# Patient Record
Sex: Male | Born: 1985 | Race: Black or African American | Hispanic: No | Marital: Single | Smoking: Current every day smoker
Health system: Southern US, Community
[De-identification: ages and names within clinical notes are randomized; demographics above are authoritative.]

---

## 2020-02-11 ENCOUNTER — Other Ambulatory Visit: Payer: Self-pay

## 2020-02-11 ENCOUNTER — Emergency Department
Admission: EM | Admit: 2020-02-11 | Discharge: 2020-02-11 | Disposition: A | Discharge status: Home / Self Care | Payer: Self-pay | Attending: Emergency Medicine | Admitting: Emergency Medicine

## 2020-02-11 ENCOUNTER — Emergency Department: Payer: Self-pay

## 2020-02-11 DIAGNOSIS — F458 Other somatoform disorders: Secondary | ICD-10-CM | POA: Insufficient documentation

## 2020-02-11 DIAGNOSIS — R0989 Other specified symptoms and signs involving the circulatory and respiratory systems: Secondary | ICD-10-CM

## 2020-02-11 DIAGNOSIS — F17210 Nicotine dependence, cigarettes, uncomplicated: Secondary | ICD-10-CM | POA: Insufficient documentation

## 2020-02-11 LAB — COMPREHENSIVE METABOLIC PANEL
ALT: 17 U/L (ref 0–44)
AST: 26 U/L (ref 15–41)
Albumin: 4.5 g/dL (ref 3.5–5.0)
Alkaline Phosphatase: 54 U/L (ref 38–126)
Anion gap: 9 (ref 5–15)
BUN: 10 mg/dL (ref 6–20)
CO2: 24 mmol/L (ref 22–32)
Calcium: 9.1 mg/dL (ref 8.9–10.3)
Chloride: 105 mmol/L (ref 98–111)
Creatinine, Ser: 0.96 mg/dL (ref 0.61–1.24)
GFR, Estimated: >60 mL/min (ref >60)
Glucose, Bld: 106 mg/dL — ABNORMAL HIGH (ref 70–99)
Potassium: 3.9 mmol/L (ref 3.5–5.1)
Sodium: 138 mmol/L (ref 135–145)
Total Bilirubin: 0.6 mg/dL (ref 0.3–1.2)
Total Protein: 7.4 g/dL (ref 6.5–8.1)

## 2020-02-11 LAB — CBC WITH DIFFERENTIAL/PLATELET
Abs Immature Granulocytes: 0.02 10*3/uL (ref 0.00–0.07)
Basophils Absolute: 0.1 10*3/uL (ref 0.0–0.1)
Basophils Relative: 1 %
Eosinophils Absolute: 0.1 10*3/uL (ref 0.0–0.5)
Eosinophils Relative: 1 %
HCT: 43.2 % (ref 39.0–52.0)
Hemoglobin: 13.9 g/dL (ref 13.0–17.0)
Immature Granulocytes: 0 %
Lymphocytes Relative: 40 %
Lymphs Abs: 2.6 10*3/uL (ref 0.7–4.0)
MCH: 29.4 pg (ref 26.0–34.0)
MCHC: 32.2 g/dL (ref 30.0–36.0)
MCV: 91.3 fL (ref 80.0–100.0)
Monocytes Absolute: 0.7 10*3/uL (ref 0.1–1.0)
Monocytes Relative: 11 %
Neutro Abs: 3 10*3/uL (ref 1.7–7.7)
Neutrophils Relative %: 47 %
Platelets: 297 10*3/uL (ref 150–400)
RBC: 4.73 MIL/uL (ref 4.22–5.81)
RDW: 12.3 % (ref 11.5–15.5)
WBC: 6.4 10*3/uL (ref 4.0–10.5)
nRBC: 0 % (ref 0.0–0.2)

## 2020-02-11 LAB — TSH: TSH: 0.632 u[IU]/mL (ref 0.350–4.500)

## 2020-02-11 IMAGING — CT CT NECK W/ CM
4 series · 16 of 33 positions shown, 19 images · IV contrast (omnipaque)
Comparison: None.

CLINICAL DATA: Cranial neuropathy

EXAM:
CT NECK WITH CONTRAST
TECHNIQUE: Multidetector CT imaging of the neck was performed using the
standard protocol following the bolus administration of intravenous
contrast.
CONTRAST:  75mL OMNIPAQUE IOHEXOL 300 MG/ML  SOLN

[Series 2: axial neck · axial · 0.52mm/px · z∈[-259,-107]mm · 5 of 116 slices shown, 7 images]
[im 20/116  soft-tissue]
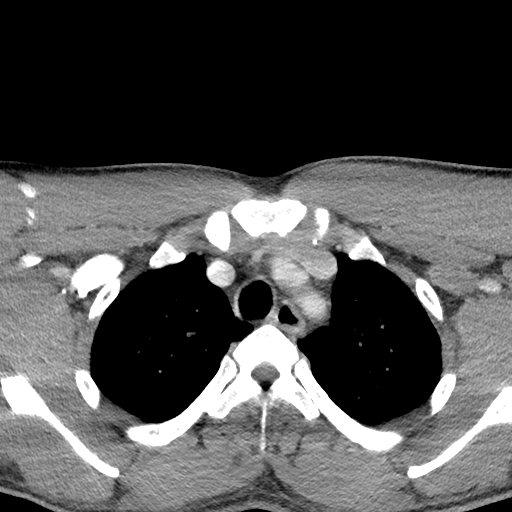
[im 20/116  bone]
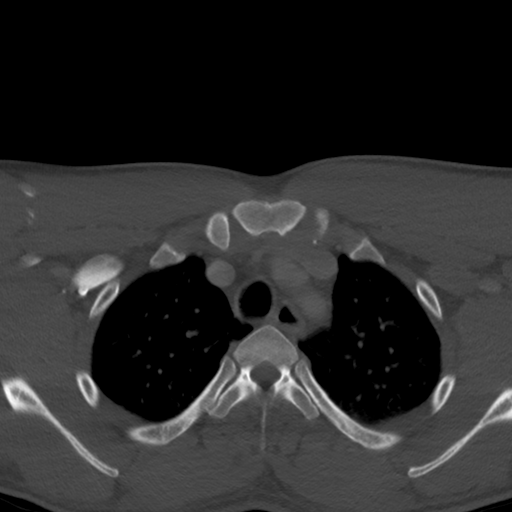
[im 39/116  bone]
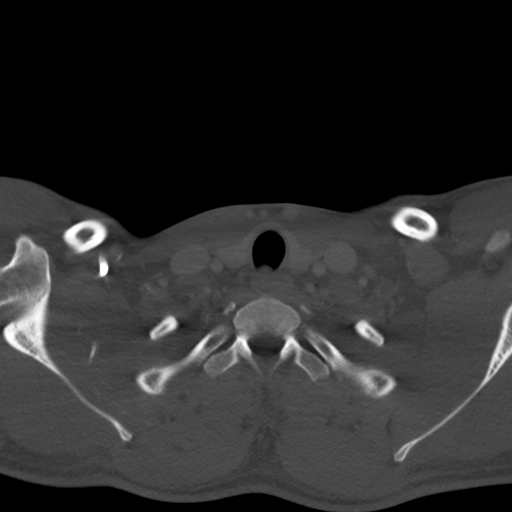
[im 58/116  bone]
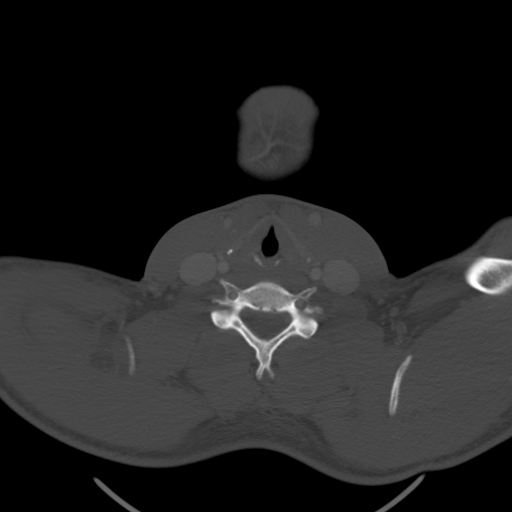
[im 77/116  bone]
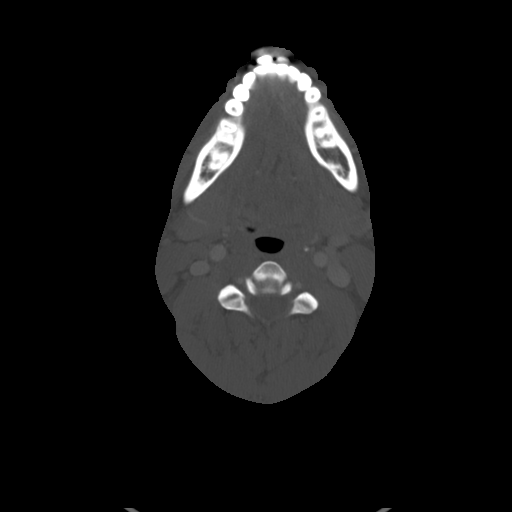
[im 96/116  soft-tissue]
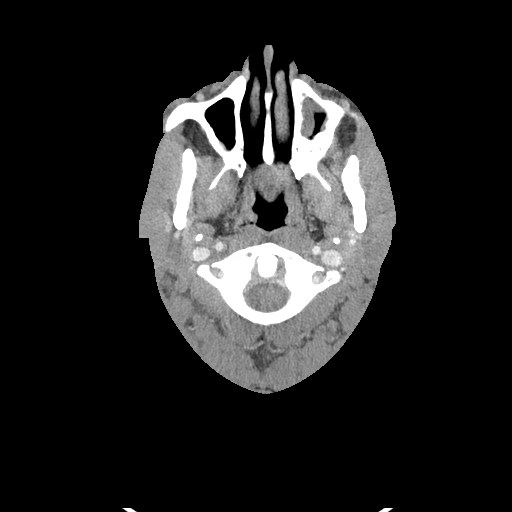
[im 96/116  bone]
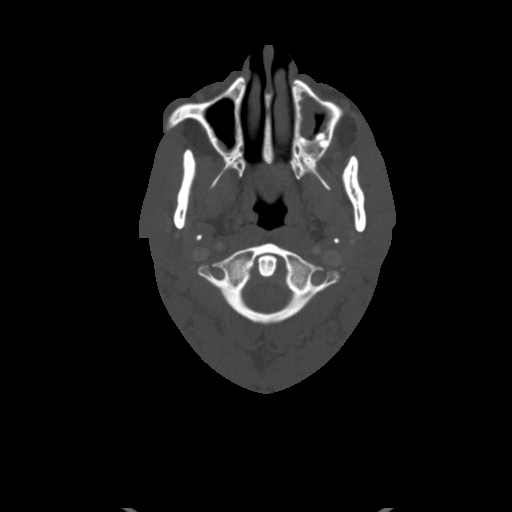

[Series 5: sag neck · sagittal · 0.50mm/px · 5 of 114 slices shown, 6 images]
[im 38/114  bone]
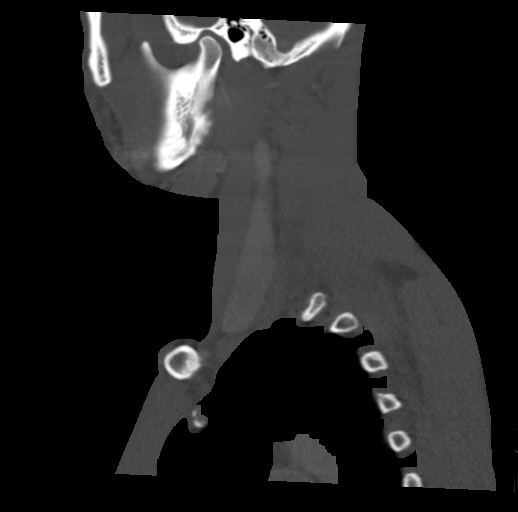
[im 48/114  bone]
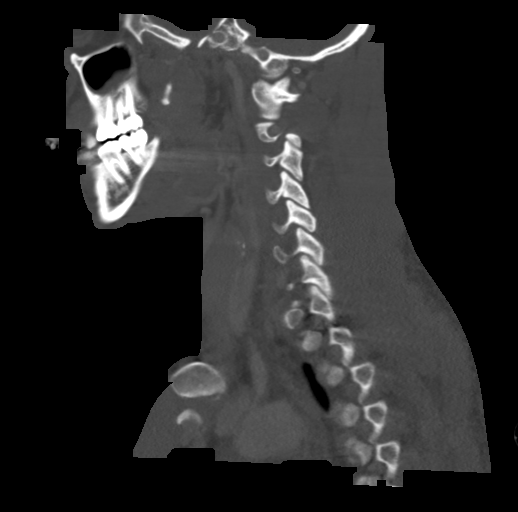
[im 57/114  soft-tissue]
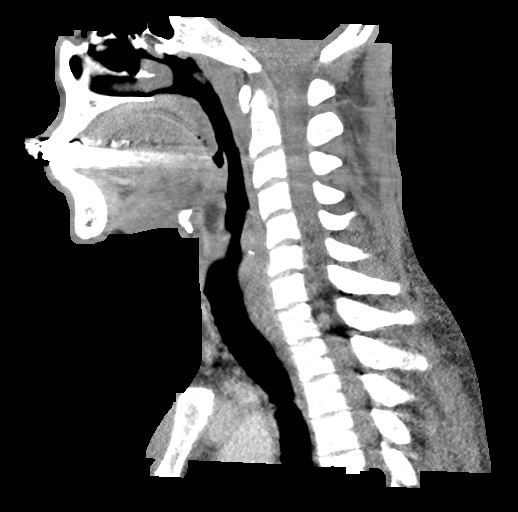
[im 57/114  bone]
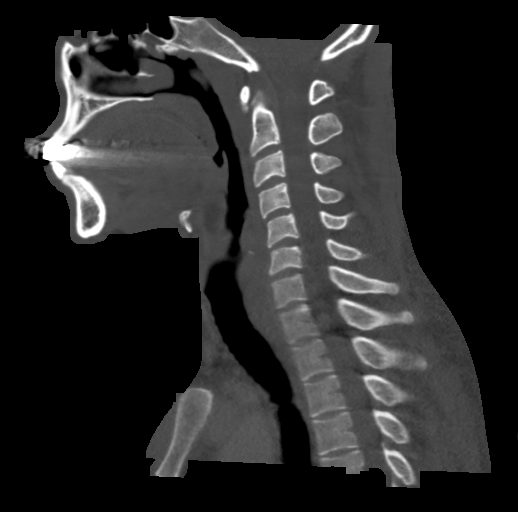
[im 66/114  bone]
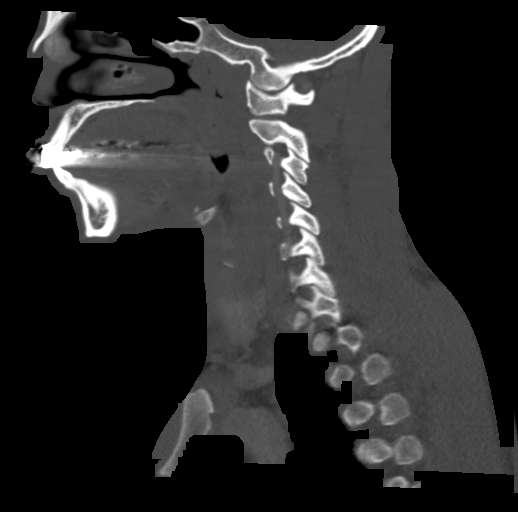
[im 76/114  bone]
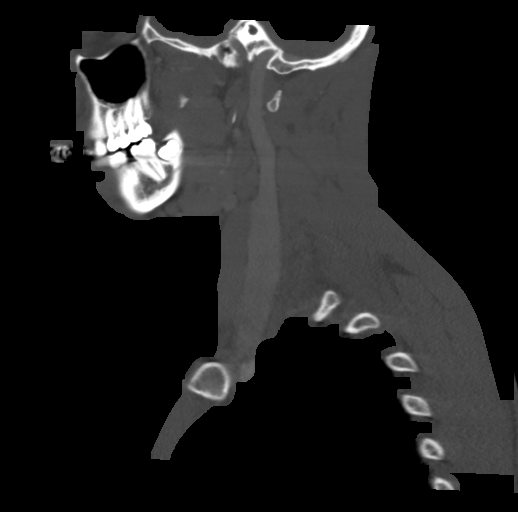

[Series 6: cor neck · coronal · 0.45mm/px · 3 of 131 slices shown]
[im 27/131  bone]
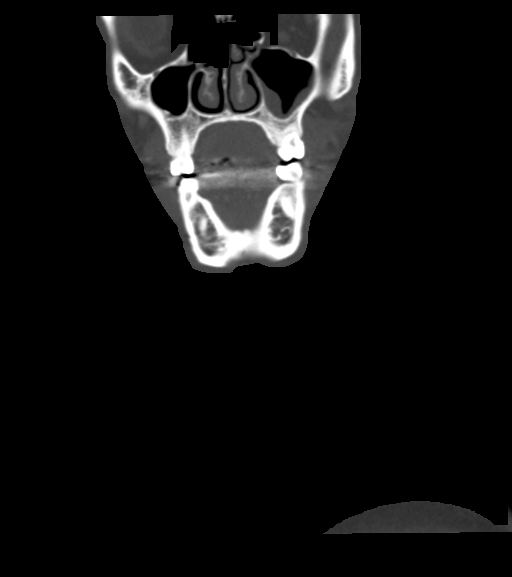
[im 53/131  bone]
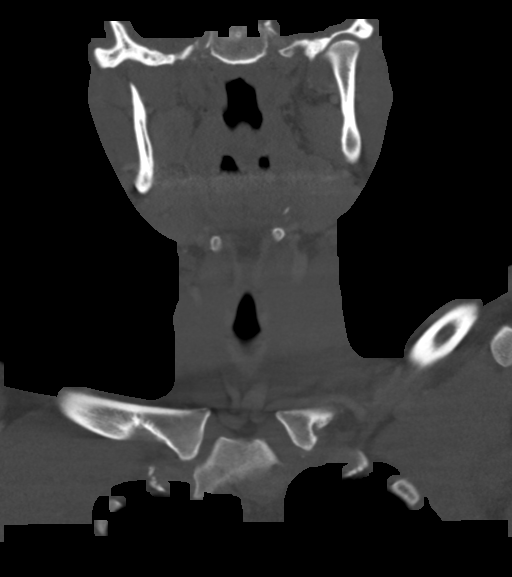
[im 79/131  bone]
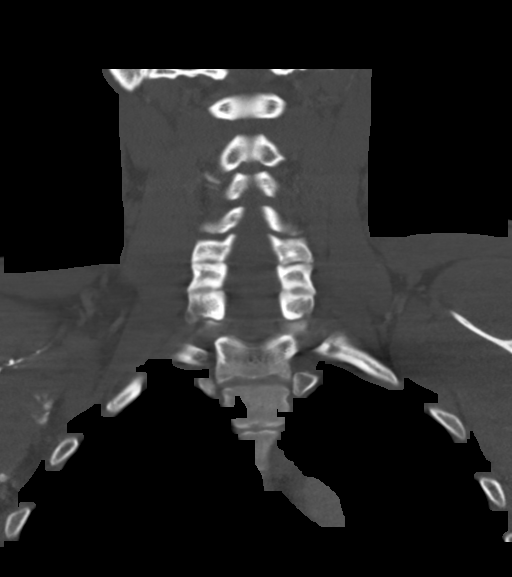

[Series 7: orthogonal ax · axial · 0.45mm/px · z∈[-269,-187]mm · 3 of 123 slices shown]
[im 21/123  bone]
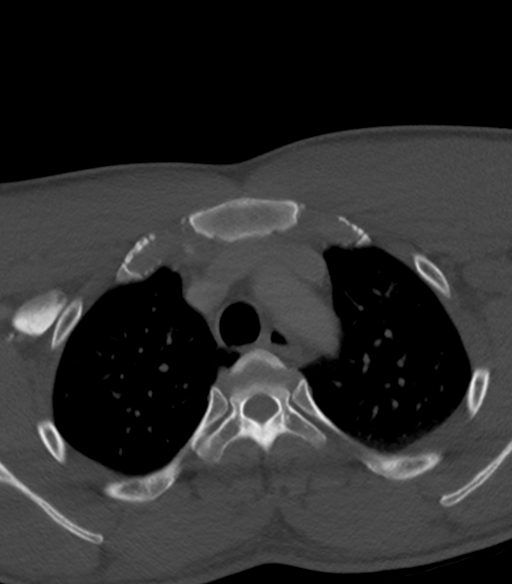
[im 41/123  bone]
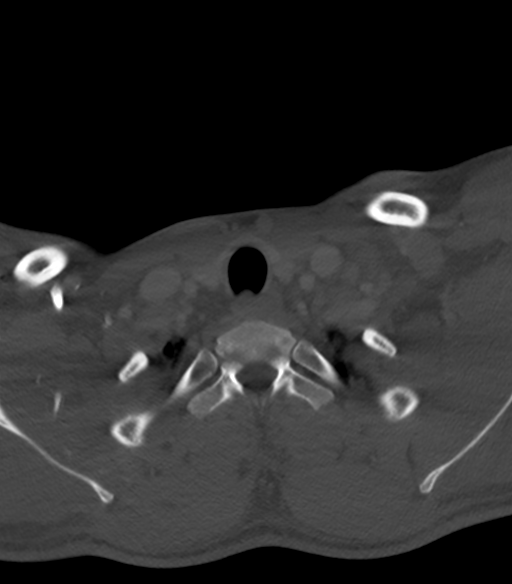
[im 62/123  bone]
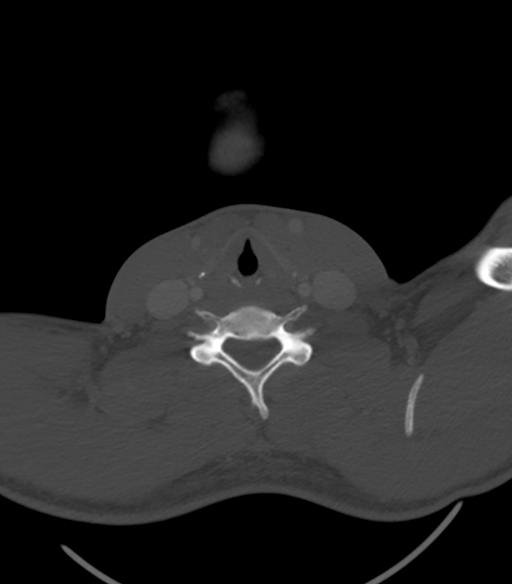

[16 of 33 positions shown; findings below may reference images not displayed]

FINDINGS: Pharynx and larynx: Normal. No mass or swelling. No evidence of
vocal cord paralysis.

Salivary glands: No inflammation, mass, or stone.

Thyroid: Normal.  No focal lesion.

Lymph nodes: None enlarged or abnormal density.

Vascular: Normal intravascular enhancement.

Limited intracranial: Grossly unremarkable.

Visualized orbits: Normal orbits.

Mastoids and visualized paranasal sinuses: Mild bilateral maxillary
sinus mucosal thickening. Clear mastoid air cells.

Skeleton: No acute or suspicious osseous abnormalities.

Upper chest: Dependent atelectasis.

Other: None.
IMPRESSION: No acute finding within the neck.

Normal thyroid gland.  No focal thyroid lesions.

## 2020-02-11 MED ORDER — IOHEXOL 300 MG/ML  SOLN
75.0000 mL | Freq: Once | INTRAMUSCULAR | Status: AC | PRN
  Administered 2020-02-11: 75 mL via INTRAVENOUS

## 2020-02-11 MED ORDER — OMEPRAZOLE 20 MG PO CPDR: 20.0000 mg | DELAYED_RELEASE_CAPSULE | Freq: Every day | ORAL | 1 refills | Status: AC

## 2020-02-11 NOTE — ED Provider Notes (Signed)
[facility] Emergency Department Provider Note   ____________________________________________   First MD Initiated Contact with Patient 02/11/20 0800     (approximate)  I have reviewed the triage vital signs and the nursing notes.   HISTORY  Chief Complaint throat pain    HPI Randall Vang is a 34 y.o. male with no significant past [place] who presents to the ED complaining of throat pain.  Patient reports that for the past couple of weeks he has felt like there is something in the back of his throat.  He denies any difficulty swallowing, has been tolerating both liquids and solids without difficulty.  He also has not had any pain but just has a constant feeling of something in the back of his throat.  He cannot recall any situation where something has gotten stuck and he denies any similar issues prior to a couple weeks ago.  He has not had any fevers, cough, chest pain, or shortness of breath.  He was seen by his PCP a few days ago, subsequently advised to be seen in the ED if his symptoms continue.        History reviewed. No pertinent past medical history.  There are no problems to display for this patient.   History reviewed. No pertinent surgical history.  Prior to Admission medications   Medication Sig Start Date End Date Taking? Authorizing Provider  omeprazole (PRILOSEC) 20 MG capsule Take 1 capsule (20 mg total) by mouth daily. 02/11/20 04/11/20  Chesley Noon, MD    Allergies Patient has no allergy information on record.  No family history on file.  Social History Social History   Tobacco Use  . Smoking status: Current Every Day Smoker    Types: Cigarettes  Substance Use Topics  . Alcohol use: Yes  . Drug use: Yes    Types: Marijuana    Review of Systems  Constitutional: No fever/chills Eyes: No visual changes. ENT: No sore throat.  Positive for globus sensation. Cardiovascular: Denies chest pain. Respiratory: Denies  shortness of breath. Gastrointestinal: No abdominal pain.  No nausea, no vomiting.  No diarrhea.  No constipation. Genitourinary: Negative for dysuria. Musculoskeletal: Negative for back pain. Skin: Negative for rash. Neurological: Negative for headaches, focal weakness or numbness.  ____________________________________________   PHYSICAL EXAM:  VITAL SIGNS: ED Triage Vitals  Enc Vitals Group     BP 02/11/20 0737 (!) 137/110     Pulse Rate 02/11/20 0737 75     Resp 02/11/20 0737 18     Temp 02/11/20 0737 98.8 F (37.1 C)     Temp Source 02/11/20 0737 Oral     SpO2 02/11/20 0737 99 %     Weight 02/11/20 0741 164 lb (74.4 kg)     Height 02/11/20 0741 5\' 9"  (1.753 m)     Head Circumference --      Peak Flow --      Pain Score 02/11/20 0741 0     Pain Loc --      Pain Edu? --      Excl. in GC? --     Constitutional: Alert and oriented. Eyes: Conjunctivae are normal. Head: Atraumatic. Nose: No congestion/rhinnorhea. Mouth/Throat: Mucous membranes are moist.  Oropharynx clear with no erythema or edema. Neck: Normal ROM, no anterior neck swelling or tenderness. Cardiovascular: Normal rate, regular rhythm. Grossly normal heart sounds. Respiratory: Normal respiratory effort.  No retractions. Lungs CTAB. Gastrointestinal: Soft and nontender. No distention. Genitourinary: deferred Musculoskeletal: No lower extremity  tenderness nor edema. Neurologic:  Normal speech and language. No gross focal neurologic deficits are appreciated. Skin:  Skin is warm, dry and intact. No rash noted. Psychiatric: Mood and affect are normal. Speech and behavior are normal.  ____________________________________________   LABS (all labs ordered are listed, but only abnormal results are displayed)  Labs Reviewed  COMPREHENSIVE METABOLIC PANEL - Abnormal; Notable for the following components:      Result Value   Glucose, Bld 106 (*)    All other components within normal limits  TSH  CBC WITH  DIFFERENTIAL/PLATELET    PROCEDURES  Procedure(s) performed (including Critical Care):  Procedures   ____________________________________________   INITIAL IMPRESSION / ASSESSMENT AND PLAN / ED COURSE       34 year old male with no significant past medical history presents to the ED with 2 weeks of feeling something in the back of his throat, denies difficulty swallowing or pain.  There is no obvious abnormality on physical exam as his oropharynx is clear and anterior neck exam is unremarkable.  He denies any difficulty swallowing and I doubt stroke as he is young and without risk factor.  We will screen labs including thyroid function, further assess with CT soft tissue neck.  Lab work is unremarkable, TSH within normal limits.  CT neck is also unremarkable, no evidence of mass or other obstruction.  Patient is able to tolerate p.o. without difficulty and is appropriate for discharge home with GI follow-up.  He was prescribed trial of omeprazole and counseled to return to the ED for new worsening symptoms, patient agrees to plan.      ____________________________________________   FINAL CLINICAL IMPRESSION(S) / ED DIAGNOSES  Final diagnoses:  Globus sensation     ED Discharge Orders         Ordered    omeprazole (PRILOSEC) 20 MG capsule  Daily        02/11/20 0930           Note:  This document was prepared using Dragon voice recognition software and may include unintentional dictation errors.   Chesley Noon, MD 02/11/20 [phone]

## 2020-02-11 NOTE — ED Triage Notes (Signed)
Pt to ED POV for chief complaint of "feeling like something is in his lower throat", was seen at PCP a few days ago and was told to come to ED for work up thyroid issues or cancer.  Denies difficulty breathing or swallowing.  RR even and unlabored, clear speech  Denies pain  This RN discussed pt with Dr Roxan Hockey

## 2021-08-01 ENCOUNTER — Emergency Department
Admission: EM | Admit: 2021-08-01 | Discharge: 2021-08-01 | Disposition: A | Discharge status: Home / Self Care | Payer: Self-pay | Attending: Emergency Medicine | Admitting: Emergency Medicine

## 2021-08-01 ENCOUNTER — Other Ambulatory Visit: Payer: Self-pay

## 2021-08-01 DIAGNOSIS — K0889 Other specified disorders of teeth and supporting structures: Secondary | ICD-10-CM | POA: Insufficient documentation

## 2021-08-01 MED ORDER — IBUPROFEN 800 MG PO TABS: 800.0000 mg | ORAL_TABLET | Freq: Three times a day (TID) | ORAL | 0 refills | Status: AC | PRN

## 2021-08-01 MED ORDER — LIDOCAINE-EPINEPHRINE 2 %-1:100000 IJ SOLN
1.7000 mL | Freq: Once | INTRAMUSCULAR | Status: AC
  Administered 2021-08-01: 1.7 mL
  Filled 2021-08-01: qty 1.7

## 2021-08-01 MED ORDER — PENICILLIN V POTASSIUM 500 MG PO TABS: 500.0000 mg | ORAL_TABLET | Freq: Four times a day (QID) | ORAL | 0 refills | Status: AC

## 2021-08-01 NOTE — ED Triage Notes (Signed)
Pt to ED via POV from home. Pt reports dental pain that has been ongoing for weeks and has gotten worse in the last few days. Pt reports bottom back left tooth that needs to be pulled but hasn't.  ?

## 2021-08-01 NOTE — ED Provider Notes (Signed)
? ?[facility] ?Provider Note ? ? ? Event Date/Time  ? First MD Initiated Contact with Patient 08/01/21 1837   ?  (approximate) ? ? ?History  ? ?Chief Complaint ?Dental Pain ? ? ?HPI ?Randall Vang is a 36 y.o. male, no remarkable medical history, presents to the emergency department for evaluation of dental pain.  Patient states that his back bottom left tooth was fractured several weeks ago.  Since then he has had intermittent pain that has gradually worsened.  He states in the past few days, he has had fever/chills and worsening pain.  He states that his back left tooth needs to be pulled, however does not have an appoint with a dentist yet as he just moved to the area recently.  Denies chest pain, shortness of breath, dysphagia, abdominal pain, cough, congestion, nausea/vomiting, diarrhea, urinary symptoms, ear pain, or dizziness/lightheadedness. ? ?History Limitations: No limitations. ? ?    ? ? ?Physical Exam  ?Triage Vital Signs: ?ED Triage Vitals  ?Enc Vitals Group  ?   BP 08/01/21 1828 (!) 136/96  ?   Pulse Rate 08/01/21 1828 88  ?   Resp 08/01/21 1828 16  ?   Temp 08/01/21 1828 99.3 ?F (37.4 ?C)  ?   Temp Source 08/01/21 1828 Oral  ?   SpO2 08/01/21 1828 96 %  ?   Weight 08/01/21 1831 166 lb (75.3 kg)  ?   Height 08/01/21 1831 5\' 9"  (1.753 m)  ?   Head Circumference --   ?   Peak Flow --   ?   Pain Score 08/01/21 1831 8  ?   Pain Loc --   ?   Pain Edu? --   ?   Excl. in [place]? --   ? ? ?Most recent vital signs: ?Vitals:  ? 08/01/21 1828 08/01/21 1922  ?BP: (!) 136/96 140/90  ?Pulse: 88 70  ?Resp: 16 16  ?Temp: 99.3 ?F (37.4 ?C)   ?SpO2: 96% 95%  ? ? ?General: Awake, NAD.  ?Skin: Warm, dry. No rashes or lesions.  ?Eyes: PERRL. Conjunctivae normal.  ?ENT: Throat clear, no erythema or exudates. Uvula midline.  ?Neck: Normal ROM. No nuchal rigidity.  ?CV: Good peripheral perfusion.  ?Resp: Normal effort.  ?Abd: Soft, non-tender. No distention.  ?Neuro: At baseline. No gross neurological  deficits.  ?MSK: No gross deformities. Normal ROM in all extremities.  ? ?Other: The 17th tooth is notably fractured, though does not appear to have any intrusion beyond the enamel.  No active bleeding or discharge.  Gumline is mildly erythematous, but no notable abscess is present. ? ?Physical Exam ? ? ? ?ED Results / Procedures / Treatments  ?Labs ?(all labs ordered are listed, but only abnormal results are displayed) ?Labs Reviewed - No data to display ? ? ?EKG ?Not applicable. ? ? ?RADIOLOGY ? ?ED Provider Interpretation: Not applicable ? ?No results found. ? ?PROCEDURES: ? ?Critical Care performed: Not applicable. ? ?Dental Block ? ?Date/Time: 08/01/2021 7:27 PM ?Performed by: Teodoro Spray, PA ?Authorized by: Teodoro Spray, PA  ? ?Consent:  ?  Consent obtained:  Verbal ?  Consent given by:  Patient ?  Risks discussed:  Swelling, nerve damage and infection ?  Alternatives discussed:  No treatment ?Universal protocol:  ?  Patient identity confirmed:  Verbally with patient ?Indications:  ?  Indications: dental pain   ?Location:  ?  Block type:  Inferior alveolar ?  Laterality:  Left ?Procedure details:  ?  Needle gauge:  27 G ?  Anesthetic injected:  Lidocaine 2% WITH epi ?Post-procedure details:  ?  Outcome:  Anesthesia achieved ?  Procedure completion:  Tolerated well, no immediate complications ? ? ? ?MEDICATIONS ORDERED IN ED: ?Medications  ?lidocaine-EPINEPHrine (XYLOCAINE W/EPI) 2 %-1:100000 (with pres) injection 1.7 mL (1.7 mLs Infiltration Given by Other 08/01/21 1915)  ? ? ? ?IMPRESSION / MDM / ASSESSMENT AND PLAN / ED COURSE  ?I reviewed the triage vital signs and the nursing notes. ?             ?               ? ?Differential diagnosis includes, but is not limited to, dental fracture, pulpitis, periodontitis, gingivitis, dental abscess. ? ?ED Course ?Patient appears well.  Vital signs within normal limits.  NAD.  Currently endorsing moderate to severe pain.  Provide the patient with a  inferior alveolar block.  Patient tolerated the procedure well with no immediate complications.  Patient experienced immediate relief. ? ?Assessment/Plan ?Patient presents with gradually worsening dental pain over the past few months following a dental fracture.  The patient's endorsement of fever/chills and acute worsening of pain over the past few days is suggestive of possible early dental infection, though no abscess is present.  Will cover for potential infection with penicillin VK.  We will additionally provide him with prescription for ibuprofen.  Advised him to follow-up with a dentist for further evaluation and management.  We will discharge ? ?Patient was provided with anticipatory guidance, return precautions, and educational material. Encouraged the patient to return to the emergency department at any time if they begin to experience any new or worsening symptoms. Patient expressed understanding and agreed with the plan. ? ?  ? ? ?FINAL CLINICAL IMPRESSION(S) / ED DIAGNOSES  ? ?Final diagnoses:  ?Pain, dental  ? ? ? ?Rx / DC Orders  ? ?ED Discharge Orders   ? ?      Ordered  ?  penicillin v potassium (VEETID) 500 MG tablet  4 times daily       ? 08/01/21 1855  ?  ibuprofen (ADVIL) 800 MG tablet  Every 8 hours PRN       ? 08/01/21 1855  ? ?  ?  ? ?  ? ? ? ?Note:  This document was prepared using Dragon voice recognition software and may include unintentional dictation errors. ?  ?Teodoro Spray, [place] ?08/01/21 1929 ? ?  ?Arta Silence, MD ?08/01/21 2300 ? ?

## 2021-08-01 NOTE — Discharge Instructions (Addendum)
-  Take all of your medications as prescribed.  You may add Tylenol to your regimen. ?-Please follow-up with a dentist, as discussed. ?-Return to the emergency department anytime if you begin to experience any new or worsening symptoms ?

## 2021-12-01 ENCOUNTER — Emergency Department
Admission: EM | Admit: 2021-12-01 | Discharge: 2021-12-01 | Disposition: A | Discharge status: Home / Self Care | Payer: Self-pay | Attending: Emergency Medicine | Admitting: Emergency Medicine

## 2021-12-01 ENCOUNTER — Encounter: Payer: Self-pay | Admitting: Emergency Medicine

## 2021-12-01 DIAGNOSIS — Z23 Encounter for immunization: Secondary | ICD-10-CM | POA: Insufficient documentation

## 2021-12-01 DIAGNOSIS — X58XXXA Exposure to other specified factors, initial encounter: Secondary | ICD-10-CM | POA: Insufficient documentation

## 2021-12-01 DIAGNOSIS — S81011A Laceration without foreign body, right knee, initial encounter: Secondary | ICD-10-CM

## 2021-12-01 DIAGNOSIS — S81811A Laceration without foreign body, right lower leg, initial encounter: Secondary | ICD-10-CM | POA: Insufficient documentation

## 2021-12-01 DIAGNOSIS — Y990 Civilian activity done for income or pay: Secondary | ICD-10-CM | POA: Insufficient documentation

## 2021-12-01 MED ORDER — TETANUS-DIPHTH-ACELL PERTUSSIS 5-2.5-18.5 LF-MCG/0.5 IM SUSY
0.5000 mL | PREFILLED_SYRINGE | Freq: Once | INTRAMUSCULAR | Status: AC
  Administered 2021-12-01: 0.5 mL via INTRAMUSCULAR
  Filled 2021-12-01: qty 0.5

## 2021-12-01 MED ORDER — ACETAMINOPHEN 500 MG PO TABS
1000.0000 mg | ORAL_TABLET | Freq: Once | ORAL | Status: AC
  Administered 2021-12-01: 1000 mg via ORAL
  Filled 2021-12-01: qty 2

## 2021-12-01 MED ORDER — LIDOCAINE-EPINEPHRINE (PF) 2 %-1:200000 IJ SOLN
10.0000 mL | Freq: Once | INTRAMUSCULAR | Status: AC
  Administered 2021-12-01: 10 mL
  Filled 2021-12-01: qty 20

## 2021-12-01 MED ORDER — IBUPROFEN 400 MG PO TABS
400.0000 mg | ORAL_TABLET | Freq: Once | ORAL | Status: AC
  Administered 2021-12-01: 400 mg via ORAL
  Filled 2021-12-01: qty 1

## 2021-12-01 NOTE — Discharge Instructions (Addendum)
-  You will need to have the sutures removed in 7 to 10 days.  This can be removed here in the emergency department or at an urgent care or by your regular doctor.  -You may wash the affected area with soap and water daily.  Keep covered with a Band-Aid while at work.  Recommend applying a topical antibiotic ointment on top of that as well.  -Return to the emergency department anytime if you begin to experience any new or worsening symptoms.

## 2021-12-01 NOTE — ED Triage Notes (Signed)
Pt presents via POV for a laceration to the medial side of his right knee that occurred while at work. Pt ambulatory to triage. Pts wound wrapped with gauze and coban.

## 2021-12-01 NOTE — ED Provider Notes (Signed)
[facility] Provider Note    Event Date/Time   First MD Initiated Contact with Patient 12/01/21 2148     (approximate)   History   Chief Complaint Extremity Laceration   HPI Randall Vang is a 36 y.o. male, no significant medical history, presents emergency department for evaluation of laceration to the right lower extremity.  Patient states that he was at work using shears to cut bushes when he accidentally cut himself along the medial aspect of his right knee.  Bleeding controlled with direct pressure.  He denies any other injuries.  He is not up-to-date with his tetanus.  Denies difficulty ambulating, lower leg pain, dizziness/lightheadedness, numbness/tingling in the affected extremity, cold sensation, or chest pain/shortness of breath.  History Limitations: No limitations.        Physical Exam  Triage Vital Signs: ED Triage Vitals  Enc Vitals Group     BP 12/01/21 2056 (!) 135/99     Pulse Rate 12/01/21 2056 80     Resp 12/01/21 2056 20     Temp 12/01/21 2056 99.7 F (37.6 C)     Temp Source 12/01/21 2056 Oral     SpO2 12/01/21 2056 98 %     Weight 12/01/21 2056 170 lb (77.1 kg)     Height 12/01/21 2056 5\' 9"  (1.753 m)     Head Circumference --      Peak Flow --      Pain Score 12/01/21 2104 7     Pain Loc --      Pain Edu? --      Excl. in [place]? --     Most recent vital signs: Vitals:   12/01/21 2056  BP: (!) 135/99  Pulse: 80  Resp: 20  Temp: 99.7 F (37.6 C)  SpO2: 98%    General: Awake, NAD.  Skin: Warm, dry. No rashes or lesions.  Eyes: PERRL. Conjunctivae normal.  CV: Good peripheral perfusion.  Resp: Normal effort.  Abd: Soft, non-tender. No distention.  Neuro: At baseline. No gross neurological deficits.   Focused Exam: 2.5 cm curved laceration along the medial aspect of the right knee.  Mild active bleeding.  No evidence of foreign bodies.  No surrounding warmth, erythema, or tenderness.  He is able to ambulate on  his own without assistance.  No bony tenderness.  Normal range of motion at the knee joint.  PMS intact distally.  Physical Exam    ED Results / Procedures / Treatments  Labs (all labs ordered are listed, but only abnormal results are displayed) Labs Reviewed - No data to display   EKG N/A.   RADIOLOGY  ED Provider Interpretation: N/A.  No results found.  PROCEDURES:  Critical Care performed: N/A.  01/31/22.Laceration Repair  Date/Time: 12/01/2021 10:45 PM  Performed by: 01/31/2022, PA Authorized by: Varney Daily, PA   Consent:    Consent obtained:  Verbal   Consent given by:  Patient   Risks, benefits, and alternatives were discussed: yes     Risks discussed:  Infection, pain, retained foreign body, need for additional repair and poor cosmetic result   Alternatives discussed:  No treatment Universal protocol:    Patient identity confirmed:  Verbally with patient Anesthesia:    Anesthesia method:  Local infiltration   Local anesthetic:  Lidocaine 2% WITH epi Laceration details:    Location:  Leg   Leg location:  R knee   Length (cm):  2.5   Depth (mm):  2 Exploration:    Hemostasis achieved with:  Direct pressure   Wound extent: no foreign bodies/material noted, no muscle damage noted, no tendon damage noted, no underlying fracture noted and no vascular damage noted   Treatment:    Area cleansed with:  Soap and water   Amount of cleaning:  Standard   Irrigation solution:  Sterile saline   Irrigation volume:  1000 ml   Irrigation method:  Pressure wash   Debridement:  None   Undermining:  None   Scar revision: yes   Skin repair:    Repair method:  Sutures   Suture size:  4-0   Suture technique:  Simple interrupted   Number of sutures:  3 Approximation:    Approximation:  Close Repair type:    Repair type:  Simple Post-procedure details:    Dressing:  Adhesive bandage   Procedure completion:  Tolerated well, no immediate  complications     MEDICATIONS ORDERED IN ED: Medications  acetaminophen (TYLENOL) tablet 1,000 mg (has no administration in time range)  ibuprofen (ADVIL) tablet 400 mg (has no administration in time range)  lidocaine-EPINEPHrine (XYLOCAINE W/EPI) 2 %-1:200000 (PF) injection 10 mL (10 mLs Infiltration Given by Other 12/01/21 2155)  Tdap (BOOSTRIX) injection 0.5 mL (0.5 mLs Intramuscular Given 12/01/21 2245)     IMPRESSION / MDM / ASSESSMENT AND PLAN / ED COURSE  I reviewed the triage vital signs and the nursing notes.                              Differential diagnosis includes, but is not limited to, laceration, tetanus, foreign body, cellulitis  Assessment/Plan Patient presents with 2.5 cm laceration to the medial aspect of the right knee.  No evidence of any osseous injuries.  No foreign bodies.  Cleansed extensively with normal saline and repaired with three 4-0 nonabsorbable sutures.  See above for details.  No indication for antibiotic prophylaxis at this time.  Provide him with a tetanus shot.  Advised him to follow-up for suture removal in 7 to 10 days.  Will plan to discharge.   Provided the patient with anticipatory guidance, return precautions, and educational material. Encouraged the patient to return to the emergency department at any time if they begin to experience any new or worsening symptoms. Patient expressed understanding and agreed with the plan.   Patient's presentation is most consistent with acute, uncomplicated illness.       FINAL CLINICAL IMPRESSION(S) / ED DIAGNOSES   Final diagnoses:  Laceration of right knee, initial encounter     Rx / DC Orders   ED Discharge Orders     None        Note:  This document was prepared using Dragon voice recognition software and may include unintentional dictation errors.   Varney Daily, [place] 12/01/21 [facility]ang    Sharyn Creamer, MD 12/02/21 [phone]

## 2021-12-01 NOTE — ED Provider Triage Note (Signed)
  Emergency Medicine Provider Triage Evaluation Note  Randall Vang , a 36 y.o.male,  was evaluated in triage.  Pt complains of laceration to the right knee.  Patient states that he was using some shears outside when he accidentally cut himself on the right knee.  Bleeding controlled with direct pressure.  Denies any other injuries.   Review of Systems  Positive: Right knee laceration Negative: Denies fever, chest pain, vomiting  Physical Exam   Vitals:   12/01/21 2056  BP: (!) 135/99  Pulse: 80  Resp: 20  Temp: 99.7 F (37.6 C)  SpO2: 98%   Gen:   Awake, no distress   Resp:  Normal effort  MSK:   Moves extremities without difficulty  Other:  2.5 cm linear laceration to the medial aspect of the right knee.  Bleeding controlled direct pressure.  Medical Decision Making  Given the patient's initial medical screening exam, the following diagnostic evaluation has been ordered. The patient will be placed in the appropriate treatment space, once one is available, to complete the evaluation and treatment. I have discussed the plan of care with the patient and I have advised the patient that an ED physician or mid-level practitioner will reevaluate their condition after the test results have been received, as the results may give them additional insight into the type of treatment they may need.    Diagnostics: None immediately.  Treatments: none immediately   Varney Daily, [place] 12/01/21 2103

## 2021-12-01 NOTE — ED Notes (Signed)
Pt laceration to R knee occurred at work today. I asked pt if it was going to be filed under wc and pt stated "I smoked weed today and I told them I did not know if I was going to come or not." Pt informed that he could chose to file or not to. Pt does not want to file wc at this time.

## 2023-02-13 ENCOUNTER — Emergency Department
Admission: EM | Admit: 2023-02-13 | Discharge: 2023-02-13 | Disposition: A | Discharge status: Home / Self Care | Payer: Self-pay | Attending: Emergency Medicine | Admitting: Emergency Medicine

## 2023-02-13 ENCOUNTER — Other Ambulatory Visit: Payer: Self-pay

## 2023-02-13 ENCOUNTER — Encounter: Payer: Self-pay | Admitting: Emergency Medicine

## 2023-02-13 DIAGNOSIS — K0889 Other specified disorders of teeth and supporting structures: Secondary | ICD-10-CM | POA: Insufficient documentation

## 2023-02-13 MED ORDER — AMOXICILLIN-POT CLAVULANATE 875-125 MG PO TABS: 1.0000 | ORAL_TABLET | Freq: Two times a day (BID) | ORAL | 0 refills | Status: AC

## 2023-02-13 MED ORDER — CHLORHEXIDINE GLUCONATE 0.12 % MT SOLN: 15.0000 mL | Freq: Two times a day (BID) | OROMUCOSAL | 0 refills | Status: AC

## 2023-02-13 NOTE — ED Provider Notes (Signed)
[facility] Provider Note    Event Date/Time   First MD Initiated Contact with Patient 02/13/23 2038     (approximate)   History   Dental Pain   HPI  Randall Vang is a 37 y.o. male who presents today for dental pain.  Patient reports that he has a known dental cavity to his left posterior molar that he needs to get fixed.  He reports that he has had pain in this for a long time, though he feels it has worsened over the last couple of days.  He has not had any facial or neck swelling or erythema.  He has not had any fevers or chills.  He has not had any intraoral swelling.  No difficulty swallowing.  There are no problems to display for this patient.         Physical Exam   Triage Vital Signs: ED Triage Vitals  Encounter Vitals Group     BP 02/13/23 2008 139/79     Systolic BP Percentile --      Diastolic BP Percentile --      Pulse Rate 02/13/23 2008 75     Resp 02/13/23 2008 18     Temp 02/13/23 2007 98.2 F (36.8 C)     Temp Source 02/13/23 2007 Oral     SpO2 02/13/23 2008 99 %     Weight 02/13/23 2007 170 lb 10.2 oz (77.4 kg)     Height --      Head Circumference --      Peak Flow --      Pain Score 02/13/23 2007 8     Pain Loc --      Pain Education --      Exclude from Growth Chart --     Most recent vital signs: Vitals:   02/13/23 2007 02/13/23 2008  BP:  139/79  Pulse:  75  Resp:  18  Temp: 98.2 F (36.8 C)   SpO2:  99%    Physical Exam Vitals and nursing note reviewed.  Constitutional:      General: Awake and alert. No acute distress.    Appearance: Normal appearance. The patient is normal weight.  HENT:     Head: Normocephalic and atraumatic.     Mouth: Mucous membranes are moist.  Obvious large dental carry to tooth #18.  There is no gingival fluctuance.  There is no sublingual swelling.  There is no facial or neck swelling or erythema.  There is no trismus or drooling. Eyes:     General: PERRL. Normal EOMs         Right eye: No discharge.        Left eye: No discharge.     Conjunctiva/sclera: Conjunctivae normal.  Cardiovascular:     Rate and Rhythm: Normal rate and regular rhythm.     Pulses: Normal pulses.  Pulmonary:     Effort: Pulmonary effort is normal. No respiratory distress.     Breath sounds: Normal breath sounds.  Abdominal:     Abdomen is soft. There is no abdominal tenderness. No rebound or guarding. No distention. Musculoskeletal:        General: No swelling. Normal range of motion.     Cervical back: Normal range of motion and neck supple.  Skin:    General: Skin is warm and dry.     Capillary Refill: Capillary refill takes less than 2 seconds.     Findings: No rash.  Neurological:  Mental Status: The patient is awake and alert.      ED Results / Procedures / Treatments   Labs (all labs ordered are listed, but only abnormal results are displayed) Labs Reviewed - No data to display   EKG     RADIOLOGY     PROCEDURES:  Critical Care performed:   Procedures   MEDICATIONS ORDERED IN ED: Medications - No data to display   IMPRESSION / MDM / ASSESSMENT AND PLAN / ED COURSE  I reviewed the triage vital signs and the nursing notes.   Differential diagnosis includes, but is not limited to, dental decay, dental caries, pulpitis, abscess.  Patient is awake and alert, hemodynamically stable and afebrile.  I reviewed the patient's chart, he has not been in this ER for approximately 1 year.  Patient was evaluated in the emergency department for dental pain. Patient has obvious large dental cary to his left lower molar. No gingival swelling or fluctuance concerning for gingival abscess.  No trismus, nuchal rigidity, neck pain, hot potato voice, uvular deviation or malocclusion to suggest deep space infection. No sublingual swelling concerning for Ludwig's angina.  Patient was started on antibiotics and chlorhexidine mouth rinse.  Patient was treated  symptomatically in the emergency department. Discussed care plan, return precautions, and advised close outpatient follow-up with dentist.  He requested a work note which was provided.  Patient agrees with plan of care.   Patient's presentation is most consistent with acute illness / injury with system symptoms.    FINAL CLINICAL IMPRESSION(S) / ED DIAGNOSES   Final diagnoses:  Pain, dental     Rx / DC Orders   ED Discharge Orders          Ordered    amoxicillin-clavulanate (AUGMENTIN) 875-125 MG tablet  2 times daily        02/13/23 2041    chlorhexidine (PERIDEX) 0.12 % solution  2 times daily        02/13/23 2041             Note:  This document was prepared using Dragon voice recognition software and may include unintentional dictation errors.   Jackelyn Hoehn, PA-C 02/13/23 2052    Trinna Post, MD 02/15/23 [phone]

## 2023-02-13 NOTE — Discharge Instructions (Signed)
Take antibiotics as prescribed.  It is very importantly follow-up with a dentist.  Please do this as soon as possible.  Please return for any new, worsening, or change in symptoms or other concerns.  It was a pleasure caring for you today.

## 2023-02-13 NOTE — ED Triage Notes (Signed)
Pt in with L lower mouth dental pain, ongoing after one of his molars chipped. Denies any fevers
